# Patient Record
Sex: Male | Born: 2000 | Hispanic: No | Marital: Single
Health system: Southern US, Community
[De-identification: ages and names within clinical notes are randomized; demographics above are authoritative.]

## PROBLEM LIST (undated history)

## (undated) DIAGNOSIS — L309 Dermatitis, unspecified: Secondary | ICD-10-CM

## (undated) DIAGNOSIS — J45909 Unspecified asthma, uncomplicated: Secondary | ICD-10-CM

## (undated) HISTORY — DX: Unspecified asthma, uncomplicated: J45.909

## (undated) HISTORY — DX: Dermatitis, unspecified: L30.9

---
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
## 2018-01-06 ENCOUNTER — Emergency Department (HOSPITAL_COMMUNITY): Payer: 59

## 2018-01-06 ENCOUNTER — Emergency Department (HOSPITAL_COMMUNITY)
Admission: EM | Admit: 2018-01-06 | Discharge: 2018-01-06 | Disposition: A | Discharge status: Home / Self Care | Payer: 59 | Attending: Emergency Medicine | Admitting: Emergency Medicine

## 2018-01-06 ENCOUNTER — Encounter (HOSPITAL_COMMUNITY): Payer: Self-pay | Admitting: Emergency Medicine

## 2018-01-06 DIAGNOSIS — Z7722 Contact with and (suspected) exposure to environmental tobacco smoke (acute) (chronic): Secondary | ICD-10-CM | POA: Diagnosis not present

## 2018-01-06 DIAGNOSIS — J45909 Unspecified asthma, uncomplicated: Secondary | ICD-10-CM | POA: Diagnosis not present

## 2018-01-06 DIAGNOSIS — Y929 Unspecified place or not applicable: Secondary | ICD-10-CM | POA: Insufficient documentation

## 2018-01-06 DIAGNOSIS — W109XXA Fall (on) (from) unspecified stairs and steps, initial encounter: Secondary | ICD-10-CM | POA: Insufficient documentation

## 2018-01-06 DIAGNOSIS — Y939 Activity, unspecified: Secondary | ICD-10-CM | POA: Insufficient documentation

## 2018-01-06 DIAGNOSIS — S93325A Dislocation of tarsometatarsal joint of left foot, initial encounter: Secondary | ICD-10-CM

## 2018-01-06 DIAGNOSIS — S92325A Nondisplaced fracture of second metatarsal bone, left foot, initial encounter for closed fracture: Secondary | ICD-10-CM | POA: Insufficient documentation

## 2018-01-06 DIAGNOSIS — S92345A Nondisplaced fracture of fourth metatarsal bone, left foot, initial encounter for closed fracture: Secondary | ICD-10-CM

## 2018-01-06 DIAGNOSIS — Y999 Unspecified external cause status: Secondary | ICD-10-CM | POA: Insufficient documentation

## 2018-01-06 DIAGNOSIS — S92344A Nondisplaced fracture of fourth metatarsal bone, right foot, initial encounter for closed fracture: Secondary | ICD-10-CM | POA: Diagnosis present

## 2018-01-06 MED ORDER — IBUPROFEN 800 MG PO TABS
800.0000 mg | ORAL_TABLET | Freq: Once | ORAL | Status: AC
  Administered 2018-01-06: 800 mg via ORAL
  Filled 2018-01-06: qty 1

## 2018-01-06 NOTE — ED Notes (Signed)
Pt transported to CT ?

## 2018-01-06 NOTE — ED Notes (Signed)
Ortho tech at bedside 

## 2018-01-06 NOTE — ED Triage Notes (Signed)
Patient complains of left foot pain and swelling after falling through a ceiling at work. Denies any other complaints. Denies LOC. Skin intact. Patient alert, oriented, and in no apparent distress at this time.

## 2018-01-06 NOTE — Discharge Instructions (Addendum)
You can take Tylenol or Ibuprofen as directed for pain. You can alternate Tylenol and Ibuprofen every 4 hours. If you take Tylenol at 1pm, then you can take Ibuprofen at 5pm. Then you can take Tylenol again at 9pm.   As we discussed, keep the walking boot on. Do not get the walking boot wet. You can be touch down weight bearing which means the foot or toes may touch the floor (such as to maintain balance), but not support any weight. Do not place actual weight on the affected leg.  Follow the RICE (Rest, Ice, Compression, Elevation) protocol as directed.  As we discussed while at home, elevate the leg above heart level to help with swelling.  Additionally, placing ice in 20-minute intervals will help with swelling.  Follow-up with either [facility] or the referred orthopedic surgeon listed in the paperwork.  Dr. Ronnie Derby will plan to see you on Tuesday.  If you decide to go back to [facility], please call their office tomorrow and arrange for an appointment in the next 4 to 5 days.  Return to emergency department for any worsening pain, worsening swelling, numbness/weakness of the leg, discoloration of the foot fever or any other worsening concerning symptoms.

## 2018-01-06 NOTE — ED Provider Notes (Signed)
Care assumed from  [facility], PA-C at shift change with ortho consult pending.   In brief, this patient is a 17 y.o. M who presents for evaluation of left foot pain that began after a fall through a ceiling where he landed on his foot. Please see note from previous provider regarding full history/physical.    PLAN: CT of LLE reveals a Lisfranc injury with acute, nondisplaced intra-articular fracture involving the bases of the second and fourth metatarsals as well as the medial cuneiform. Ortho consult pending.   MDM:  Discussed patient with Dr. Ronnie Derby (Ortho) who is taking call for Ralie[facility] Ip today. Reviewed CT findings with him. He recommends CAM walker boot with touch down weight bearing. He will plan to follow-up with patient on 01/10/18. Additionally, he states that since he is not with Raliegh Ip, the family can also contact their office for an appointment if they wish to go back.   Updated mom and patient on plan.  Encourage at home supportive therapies.  Instructed patient to follow-up with either Dr. Ruel Favors office or Raliegh Ip as directed. Patient had ample opportunity for questions and discussion. All patient's questions were answered with full understanding. Strict return precautions discussed. Patient expresses understanding and agreement to plan.   1. Nondisplaced fracture of fourth metatarsal bone, left foot, initial encounter for closed fracture   2. Nondisplaced fracture of second metatarsal bone, left foot, initial encounter for closed fracture   3. Lisfranc dislocation, left, initial encounter        Desma Mcgregor 01/06/18 1849    Jola Schmidt, MD 01/07/18 [phone]

## 2018-01-06 NOTE — ED Notes (Signed)
Paged ortho 

## 2018-01-06 NOTE — Progress Notes (Signed)
Orthopedic Tech Progress Note Patient Details:  Ryan Gillespie [facility] 23-Jan-2001 [id number]  Ortho Devices Type of Ortho Device: CAM walker, Crutches Ortho Device/Splint Location: lle Ortho Device/Splint Interventions: Application   Post Interventions Patient Tolerated: Well Instructions Provided: Care of device   Hildred Priest 01/06/2018, 6:44 PM

## 2018-01-06 NOTE — ED Provider Notes (Signed)
[place] EMERGENCY DEPARTMENT Provider Note   CSN: [id number] Arrival date & time: 01/06/18  1419     History   Chief Complaint Chief Complaint  Patient presents with  . Foot Pain    HPI Ryan Gillespie is a 17 y.o. male presenting for evaluation of left pain.  Patient states that he was up in the follow-up when he missed a step and fell through the ceiling, landing on the plantar aspect of his left foot.  He reports acute onset left foot pain.  He denies hitting his head or loss of consciousness.  He denies pain elsewhere.  He has not been able to ambulate on his foot since due to pain.  This happened just prior to arrival.  He has not taken anything for pain including Tylenol or ibuprofen.  There is no radiation of the pain.  Nothing makes it better or worse.  He has no medical problems, does not take medications daily.  HPI  Past Medical History:  Diagnosis Date  . Asthma    exercise induce in elementary. NO longer a problem   . Eczema     Patient Active Problem List   Diagnosis Date Noted  . Follow-up exam 06/24/2017  . Encounter for routine child health examination without abnormal findings 08/31/2016  . Impetigo 04/17/2014  . BMI (body mass index), pediatric, 5% to less than 85% for age 24/16/2015  . Well child check 08/22/2012    History reviewed. No pertinent surgical history.      Home Medications    Prior to Admission medications   Medication Sig Start Date End Date Taking? Authorizing Provider  cephALEXin (KEFLEX) 250 MG/5ML suspension Take 10 mLs (500 mg total) by mouth 3 (three) times daily. 04/17/14   Marcha Solders, MD  fluticasone (FLONASE) 50 MCG/ACT nasal spray Place 2 sprays into both nostrils daily. 08/28/13 08/28/14  Maurice March, MD    Family History Family History  Problem Relation Age of Onset  . Asthma Mother   . Asthma Sister   . Hypertension Maternal Grandmother   . Asthma Maternal Grandfather   . Asthma  Paternal Grandmother   . Asthma Paternal Grandfather   . Heart disease Maternal Uncle        cardiomyopathy  . Hyperlipidemia Maternal Uncle   . Alcohol abuse Neg Hx   . Arthritis Neg Hx   . Birth defects Neg Hx   . Cancer Neg Hx   . COPD Neg Hx   . Depression Neg Hx   . Diabetes Neg Hx   . Drug abuse Neg Hx   . Early death Neg Hx   . Hearing loss Neg Hx   . Kidney disease Neg Hx   . Learning disabilities Neg Hx   . Mental illness Neg Hx   . Mental retardation Neg Hx   . Miscarriages / Stillbirths Neg Hx   . Stroke Neg Hx   . Vision loss Neg Hx   . Varicose Veins Neg Hx     Social History Social History   Tobacco Use  . Smoking status: Passive Smoke Exposure - Never Smoker  . Smokeless tobacco: Never Used  Substance Use Topics  . Alcohol use: No  . Drug use: No     Allergies   Patient has no known allergies.   Review of Systems Review of Systems  Musculoskeletal: Positive for arthralgias and joint swelling.  Neurological: Negative for numbness.  Hematological: Does not bruise/bleed easily.  Physical Exam Updated Vital Signs BP (!) 137/58 (BP Location: Left Arm)   Pulse 85   Temp 98.5 F (36.9 C) (Oral)   Resp 16   SpO2 100%   Physical Exam  Constitutional: He is oriented to person, place, and time. He appears well-developed and well-nourished. No distress.  HENT:  Head: Normocephalic and atraumatic.  Eyes: EOM are normal.  Neck: Normal range of motion.  Pulmonary/Chest: Effort normal.  Abdominal: He exhibits no distension.  Musculoskeletal: He exhibits edema and tenderness.  Obvious swelling of the left foot.  Tenderness palpation of the dorsal lateral foot and plantar foot.  Pedal pulses intact.  Good cap refill.  No tenderness palpation of Achilles tendon or calf.  Achilles tendon intact.  Decreased range of motion due to pain.  Neurological: He is alert and oriented to person, place, and time.  Skin: Skin is warm. Capillary refill takes less  than 2 seconds. No rash noted.  Small superficial laceration of the medial right arm without active bleeding.  Psychiatric: He has a normal mood and affect.  Nursing note and vitals reviewed.    ED Treatments / Results  Labs (all labs ordered are listed, but only abnormal results are displayed) Labs Reviewed - No data to display  EKG None  Radiology Dg Foot Complete Left  Result Date: 01/06/2018 CLINICAL DATA:  Left foot pain and swelling over the last however. Fell through the ceiling. EXAM: LEFT FOOT - COMPLETE 3+ VIEW COMPARISON:  None. FINDINGS: Pronounced midfoot and forefoot soft tissue swelling. No definite fracture identified. Question the possibility of a subtle Lisfranc injury at the base of the fourth metatarsal. Consider foot CT for further evaluation. IMPRESSION: Question subtle evidence of a Lisfranc injury at the base of the fourth metatarsal. Pronounced regional soft tissue swelling. Consider CT for further evaluation. Electronically Signed   By: Nelson Chimes M.D.   On: 01/06/2018 15:04    Procedures Procedures (including critical care time)  Medications Ordered in ED Medications  ibuprofen (ADVIL,MOTRIN) tablet 800 mg (has no administration in time range)     Initial Impression / Assessment and Plan / ED Course  I have reviewed the triage vital signs and the nursing notes.  Pertinent labs & imaging results that were available during my care of the patient were reviewed by me and considered in my medical decision making (see chart for details).     Patient presenting for evaluation of left foot pain after a fall.  Physical exam shows obvious swelling and tenderness of the left foot dorsally and along the plantar aspect.  Fetal pulses intact.  X-ray reviewed and interpreted by me, concern for possible Lisfranc injury.  Recommending CT for further evaluation.  Patient did not want anything strong for pain, will give ibuprofen.  Rest of exam reassuring, doubt head,  neck, back, pelvis, or knee injury.   CT concerning for Lisfranc injury with nondisplaced intra-articular fracture at the base of the second and fourth metatarsals with medial cuneiform involvement.  Will consult with orthopedics.  Patient sees Ralie[facility] Ip, will consult with them.  Pt signed out to Carlota Raspberry, PA-C for f/u consult with orthopedics and final dispo.   Final Clinical Impressions(s) / ED Diagnoses   Final diagnoses:  None    ED Discharge Orders    None       Franchot Heidelberg, PA-C 01/06/18 1742    Jola Schmidt, MD 01/07/18 [phone]

## 2018-01-06 NOTE — ED Notes (Signed)
Patient returned from CT

## 2018-01-06 NOTE — ED Notes (Signed)
Patient able to ambulate independently with crutches and cam walker 

## 2018-01-06 NOTE — ED Provider Notes (Signed)
Patient placed in Quick Look pathway, seen and evaluated   Chief Complaint: foot pain  HPI:   Brysun Eschmann is a 17 y.o. Patient complains of left foot pain and swelling after falling through a ceiling at work. Denies any other injuries   ROS: M/S: left foot pain, swelling   Physical Exam:  BP (!) 137/58 (BP Location: Left Arm)   Pulse 85   Temp 98.5 F (36.9 C) (Oral)   Resp 16   SpO2 100%    Gen: No distress  Neuro: Awake and Alert  Skin: Warm and dry, intact  M/S: left foot with swelling and tenderness on exam.    Initiation of care has begun. The patient has been counseled on the process, plan, and necessity for staying for the completion/evaluation, and the remainder of the medical screening examination    Ashley Murrain, NP 01/06/18 1434    Charlesetta Shanks, MD 01/14/18 [phone]

## 2019-03-23 IMAGING — CT CT FOOT*L* W/O CM
2 series · 13 of 27 positions shown, 16 images · non-contrast
Comparison: Left foot x-rays from same day.

CLINICAL DATA: Left foot pain and swelling after falling through
ceiling at work.

EXAM:
CT OF THE LEFT FOOT WITHOUT CONTRAST
TECHNIQUE: Multidetector CT imaging of the left foot was performed according to
the standard protocol. Multiplanar CT image reconstructions were
also generated.

[Series 6: lower ext 1.5 st · axial · 0.50mm/px · z∈[+158,+356]mm · 8 of 157 slices shown, 10 images]
[im 13/157  soft-tissue]
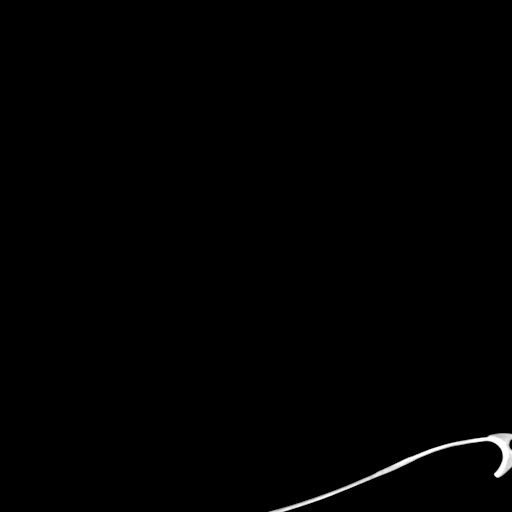
[im 13/157  bone]
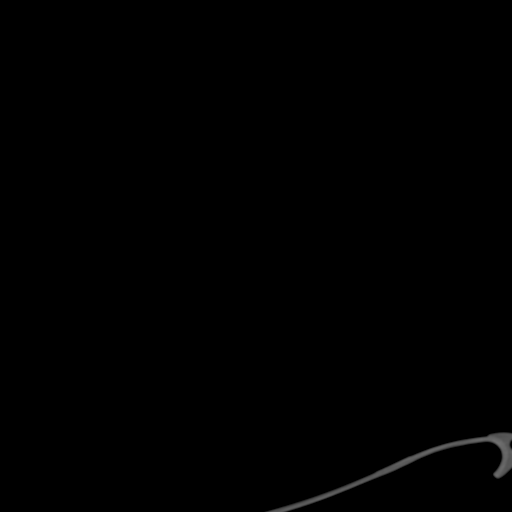
[im 37/157  bone]
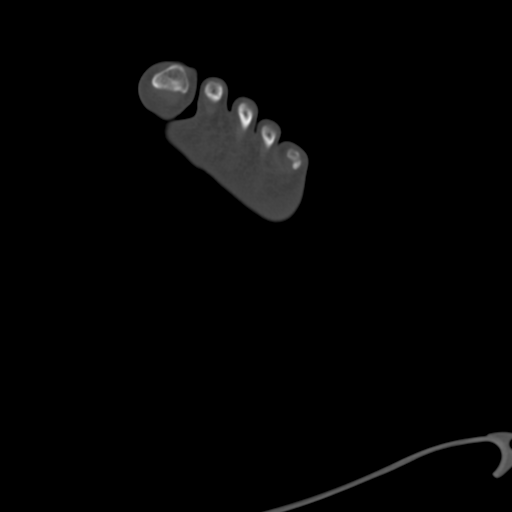
[im 49/157  bone]
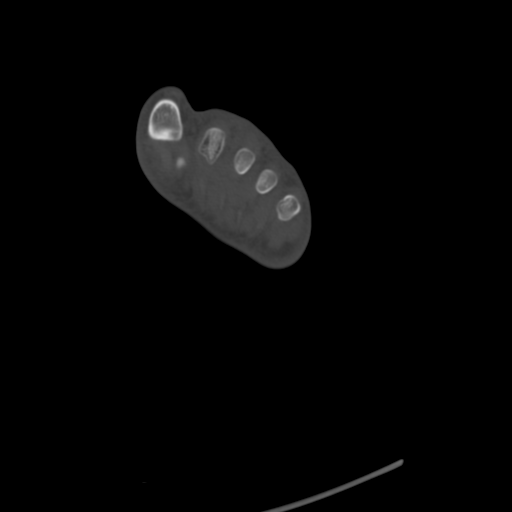
[im 73/157  bone]
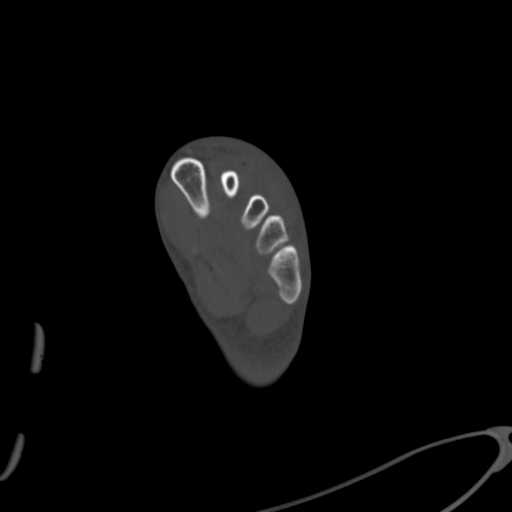
[im 85/157  soft-tissue]
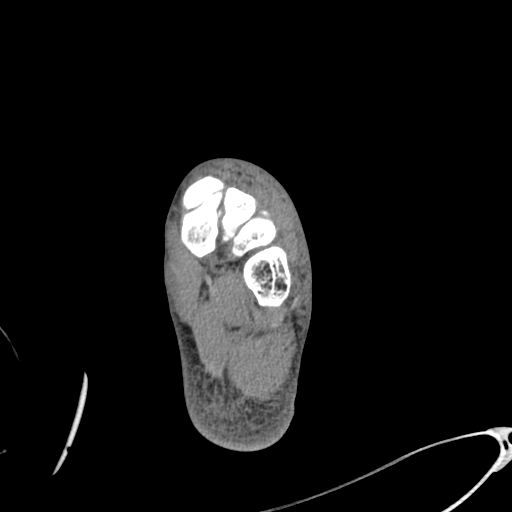
[im 85/157  bone]
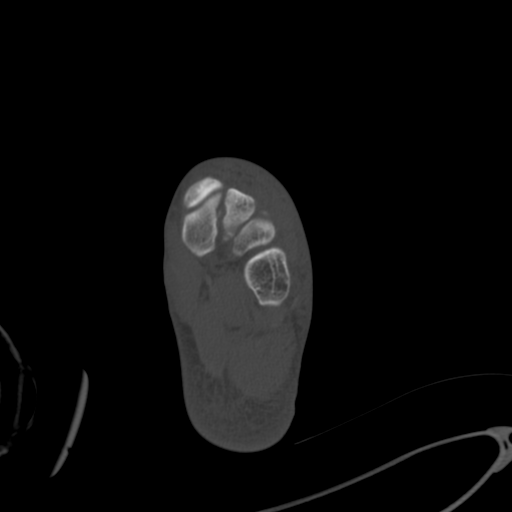
[im 109/157  bone]
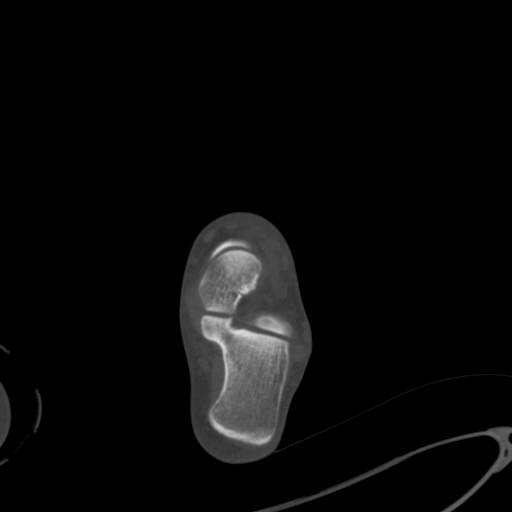
[im 121/157  bone]
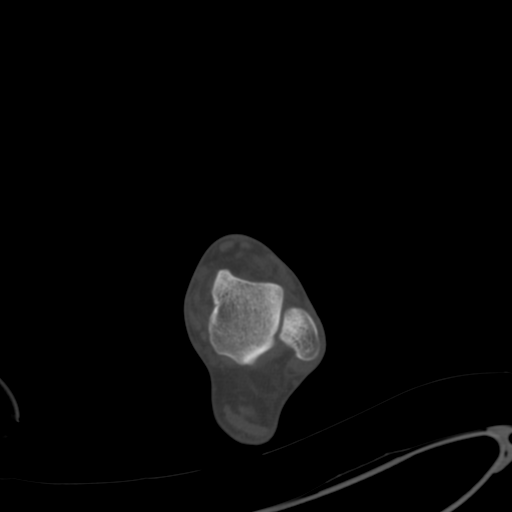
[im 145/157  bone]
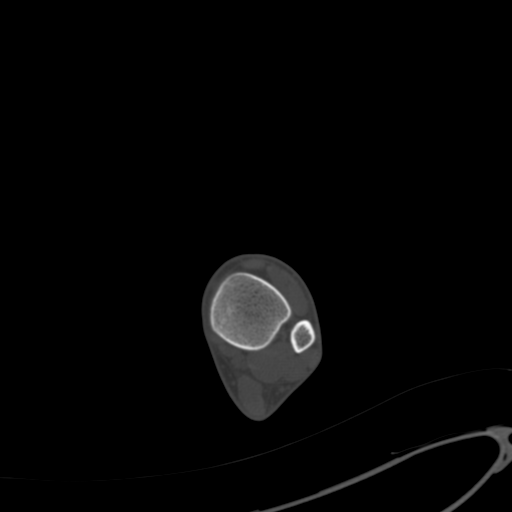

[Series 12: lower ext sag st · sagittal · 0.34mm/px · 5 of 78 slices shown, 6 images]
[im 26/78  bone]
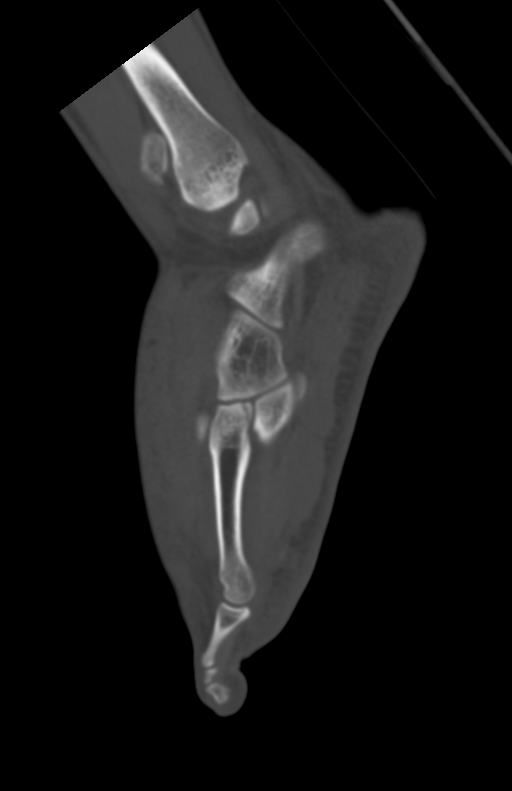
[im 33/78  bone]
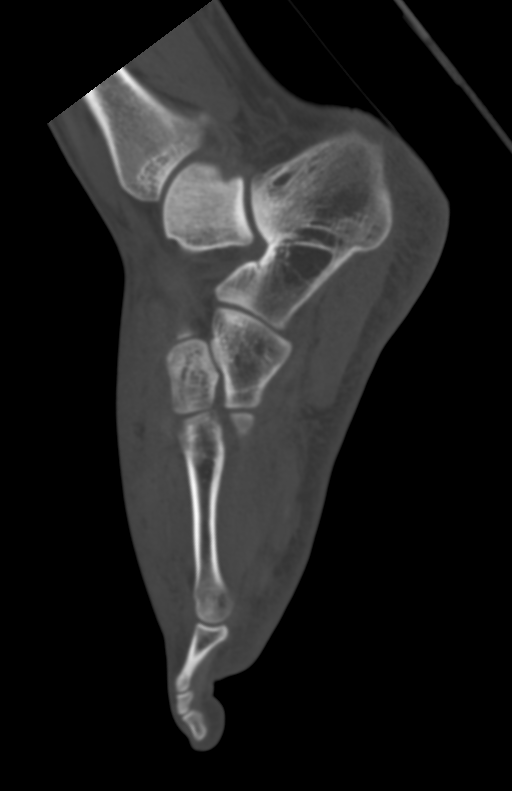
[im 39/78  soft-tissue]
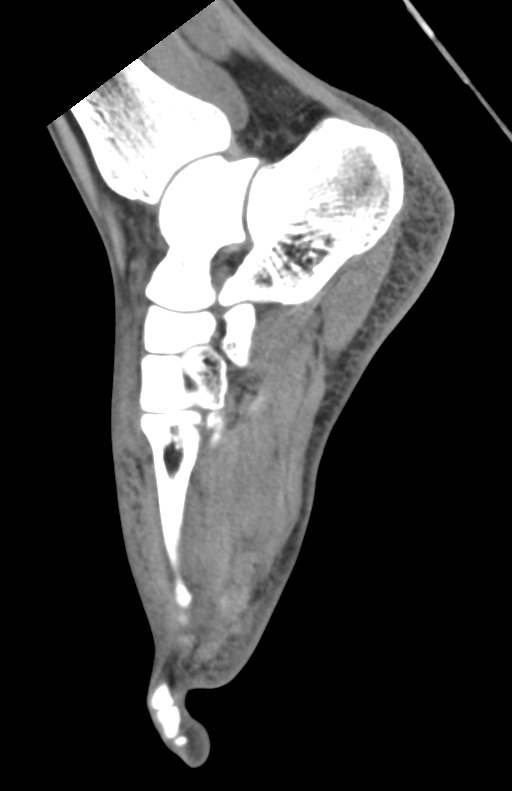
[im 39/78  bone]
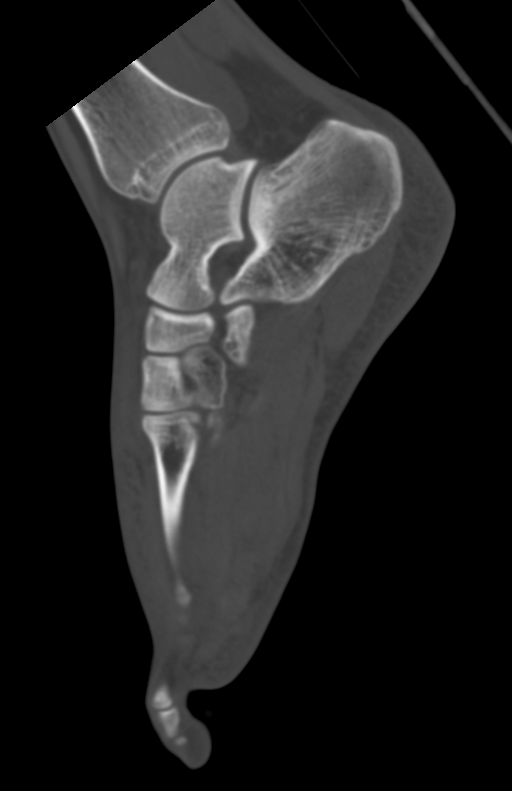
[im 45/78  bone]
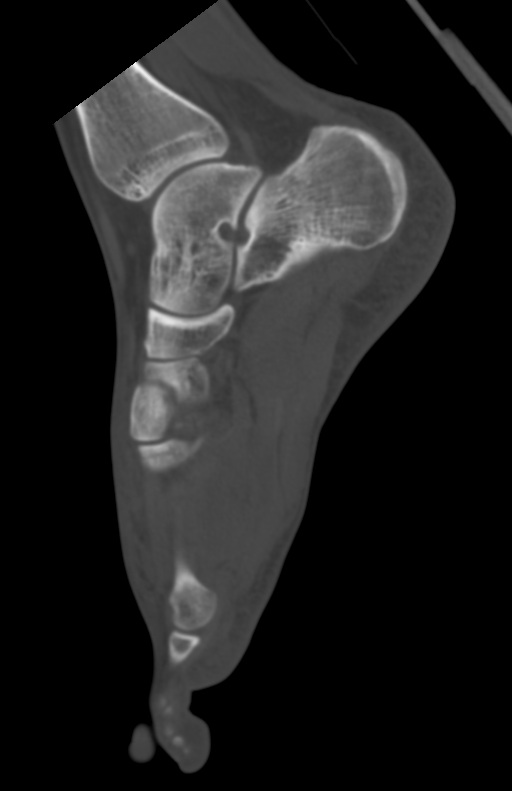
[im 52/78  bone]
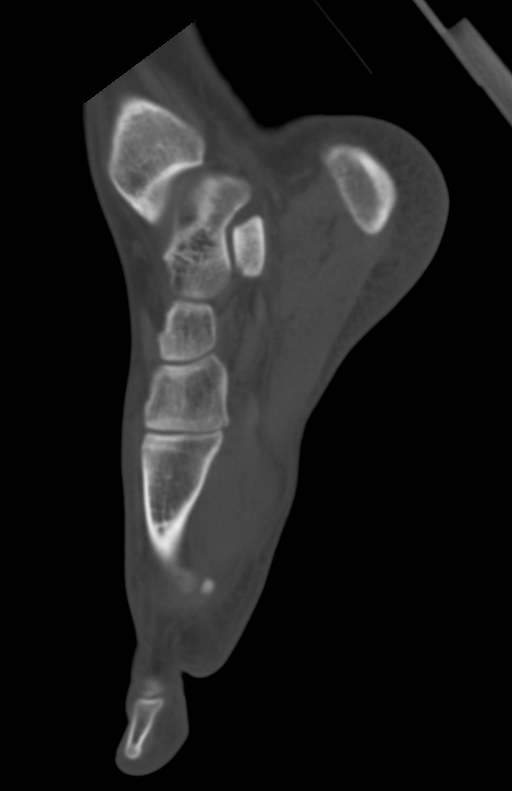

[13 of 27 positions shown; findings below may reference images not displayed]

FINDINGS: Bones/Joint/Cartilage

Acute, nondisplaced intra-articular fractures at the bases of the
second and fourth metatarsals and medial cuneiform. Small ossific
fragments in the region of the interosseous Lisfranc ligament.
Normal alignment. No joint effusion.

Ligaments

Ligaments are suboptimally evaluated by CT.

Muscles and Tendons
No muscle atrophy. Enlargement of the intrinsic plantar musculature
likely reflects hematoma. The flexor, extensor, peroneal, and
Achilles tendons are intact.

Soft tissue
Moderate dorsal forefoot soft tissue swelling. No fluid collection.
No soft tissue mass.
IMPRESSION: 1. Lisfranc injury with acute, nondisplaced intra-articular
fractures involving the bases of the second and fourth metatarsals
as well as the medial cuneiform. Small ossific fragments in the
region of the interosseous Lisfranc ligament. No significant
subluxation.

## 2019-03-23 IMAGING — DX DG FOOT COMPLETE 3+V*L*
3 series · 3 of 3 positions shown · non-contrast
Comparison: None.

CLINICAL DATA: Left foot pain and swelling over the last however.
Fell through the ceiling.

EXAM:
LEFT FOOT - COMPLETE 3+ VIEW

[foot ap]
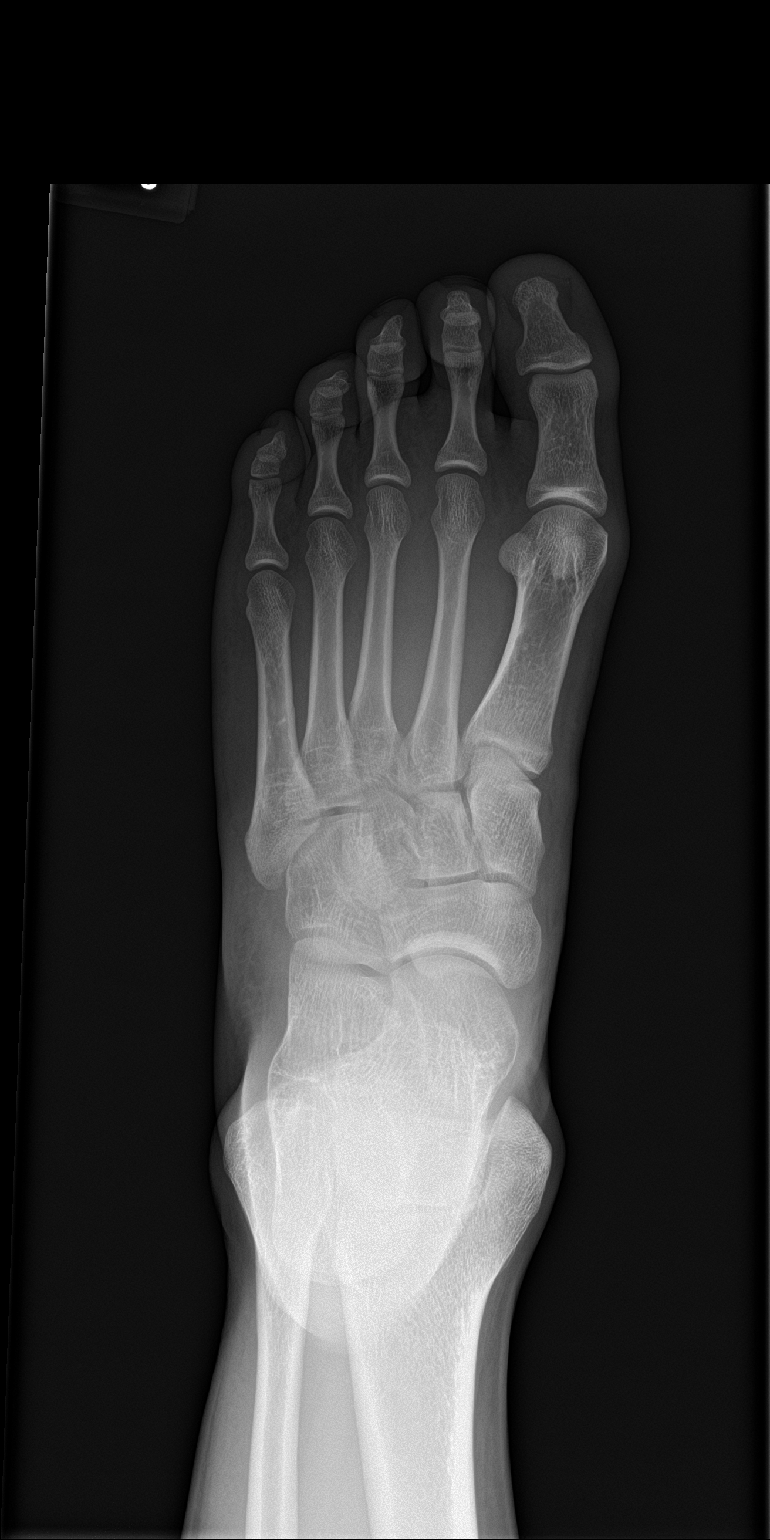

[foot obl]
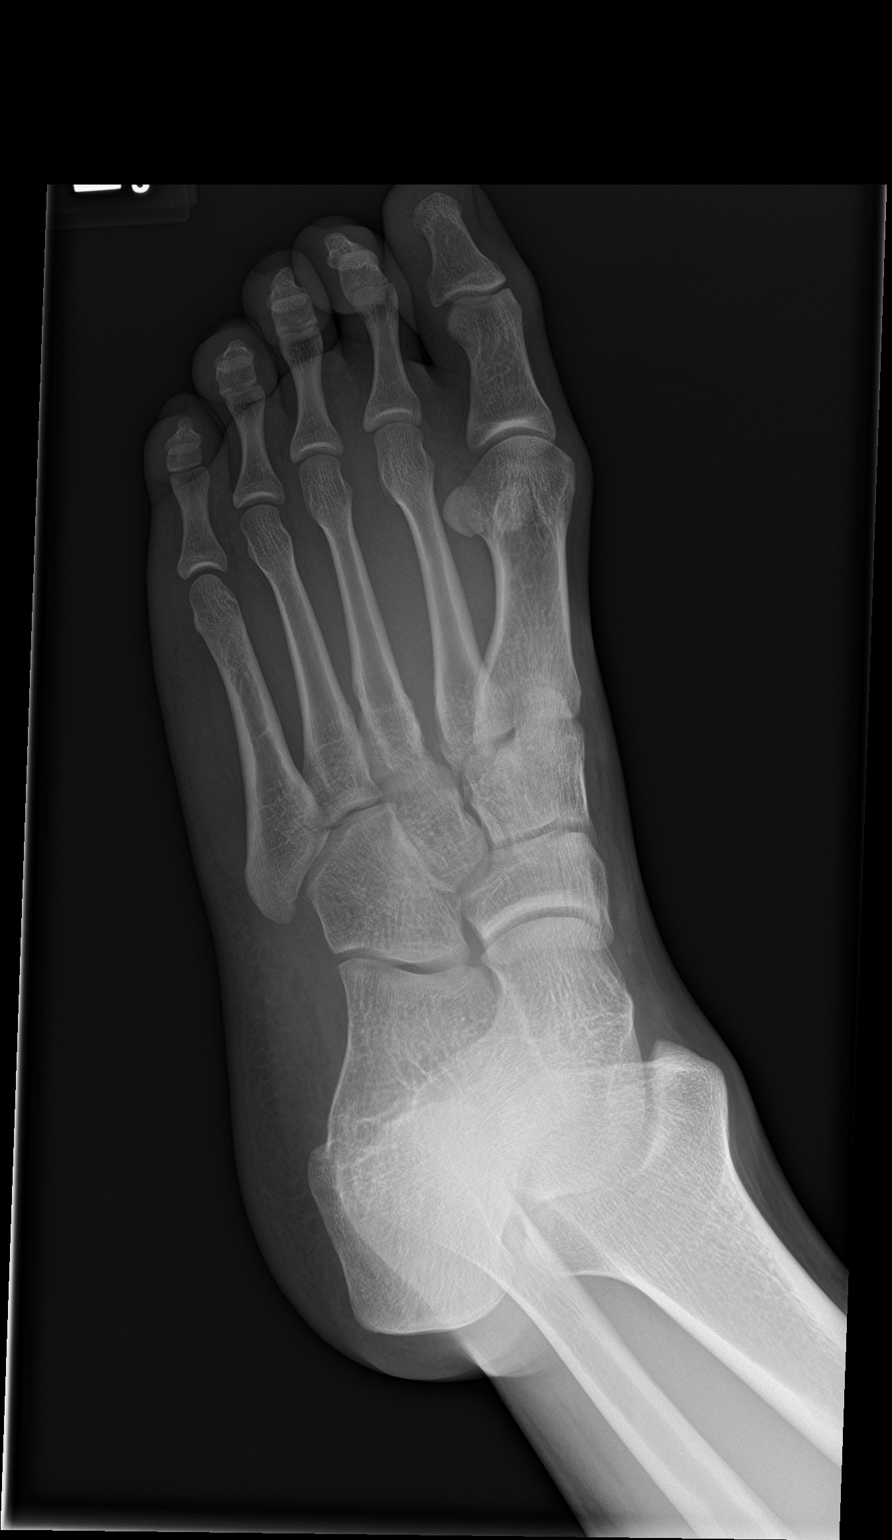

[foot lat]
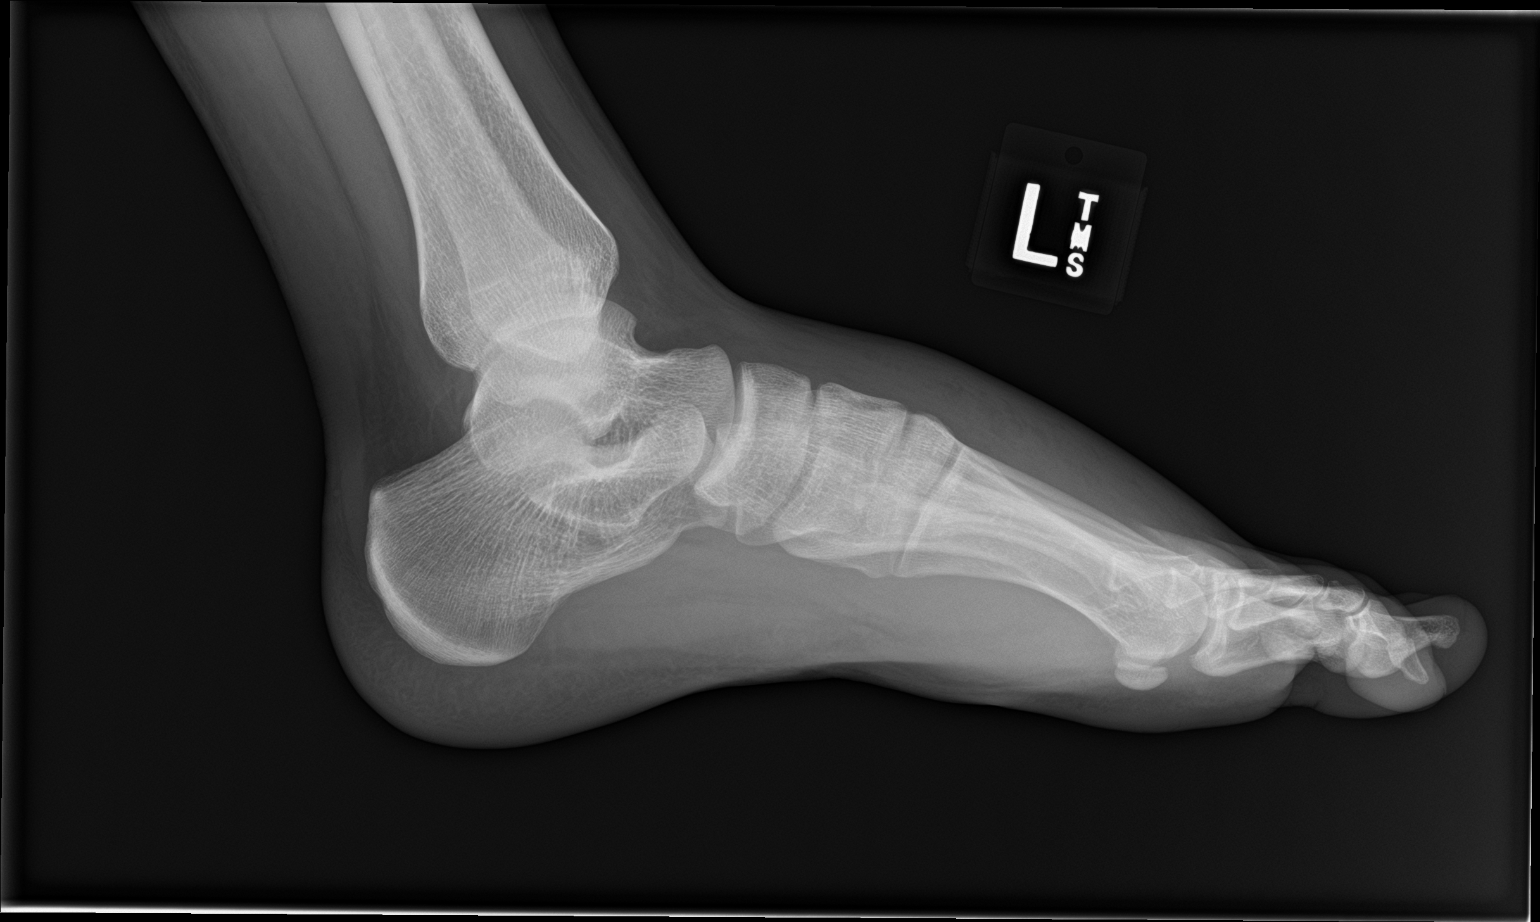

[3 of 3 positions shown; findings below may reference images not displayed]

FINDINGS: Pronounced midfoot and forefoot soft tissue swelling. No definite
fracture identified. Question the possibility of a subtle Lisfranc
injury at the base of the fourth metatarsal. Consider foot CT for
further evaluation.
IMPRESSION: Question subtle evidence of a Lisfranc injury at the base of the
fourth metatarsal. Pronounced regional soft tissue swelling.
Consider CT for further evaluation.
